# Patient Record
Sex: Male | Born: 1990 | Race: White | Hispanic: No | Smoking: Former smoker
Health system: Southern US, Community
[De-identification: ages and names within clinical notes are randomized; demographics above are authoritative.]

## PROBLEM LIST (undated history)

## (undated) DIAGNOSIS — N200 Calculus of kidney: Secondary | ICD-10-CM

## (undated) HISTORY — DX: Calculus of kidney: N20.0

## (undated) HISTORY — PX: APPENDECTOMY: SHX54

---

## 2019-02-28 ENCOUNTER — Emergency Department (HOSPITAL_BASED_OUTPATIENT_CLINIC_OR_DEPARTMENT_OTHER): Payer: 59

## 2019-02-28 ENCOUNTER — Emergency Department (HOSPITAL_BASED_OUTPATIENT_CLINIC_OR_DEPARTMENT_OTHER)
Admission: EM | Admit: 2019-02-28 | Discharge: 2019-02-28 | Disposition: A | Discharge status: Home / Self Care | Payer: 59 | Attending: Emergency Medicine | Admitting: Emergency Medicine

## 2019-02-28 ENCOUNTER — Other Ambulatory Visit: Payer: Self-pay

## 2019-02-28 ENCOUNTER — Encounter (HOSPITAL_BASED_OUTPATIENT_CLINIC_OR_DEPARTMENT_OTHER): Payer: Self-pay | Admitting: Emergency Medicine

## 2019-02-28 DIAGNOSIS — Z79899 Other long term (current) drug therapy: Secondary | ICD-10-CM | POA: Insufficient documentation

## 2019-02-28 DIAGNOSIS — R1031 Right lower quadrant pain: Secondary | ICD-10-CM | POA: Diagnosis present

## 2019-02-28 DIAGNOSIS — N201 Calculus of ureter: Secondary | ICD-10-CM | POA: Insufficient documentation

## 2019-02-28 LAB — BASIC METABOLIC PANEL
Anion gap: 7 (ref 5–15)
BUN: 11 mg/dL (ref 6–20)
CO2: 26 mmol/L (ref 22–32)
Calcium: 8.8 mg/dL — ABNORMAL LOW (ref 8.9–10.3)
Chloride: 106 mmol/L (ref 98–111)
Creatinine, Ser: 0.89 mg/dL (ref 0.61–1.24)
GFR calc Af Amer: >60 mL/min (ref >60)
GFR calc non Af Amer: >60 mL/min (ref >60)
Glucose, Bld: 87 mg/dL (ref 70–99)
Potassium: 3.3 mmol/L — ABNORMAL LOW (ref 3.5–5.1)
Sodium: 139 mmol/L (ref 135–145)

## 2019-02-28 LAB — URINALYSIS, ROUTINE W REFLEX MICROSCOPIC
Glucose, UA: NEGATIVE mg/dL
Ketones, ur: NEGATIVE mg/dL
Nitrite: NEGATIVE
Protein, ur: 30 mg/dL — AB
Specific Gravity, Urine: >1.03 — ABNORMAL HIGH (ref 1.005–1.030)
pH: 5.5 (ref 5.0–8.0)

## 2019-02-28 LAB — CBC WITH DIFFERENTIAL/PLATELET
Abs Immature Granulocytes: 0.01 10*3/uL (ref 0.00–0.07)
Basophils Absolute: 0 10*3/uL (ref 0.0–0.1)
Basophils Relative: 1 %
Eosinophils Absolute: 0.2 10*3/uL (ref 0.0–0.5)
Eosinophils Relative: 5 %
HCT: 51.6 % (ref 39.0–52.0)
Hemoglobin: 16.8 g/dL (ref 13.0–17.0)
Immature Granulocytes: 0 %
Lymphocytes Relative: 27 %
Lymphs Abs: 1.5 10*3/uL (ref 0.7–4.0)
MCH: 31.1 pg (ref 26.0–34.0)
MCHC: 32.6 g/dL (ref 30.0–36.0)
MCV: 95.6 fL (ref 80.0–100.0)
Monocytes Absolute: 0.5 10*3/uL (ref 0.1–1.0)
Monocytes Relative: 9 %
Neutro Abs: 3.2 10*3/uL (ref 1.7–7.7)
Neutrophils Relative %: 58 %
Platelets: 222 10*3/uL (ref 150–400)
RBC: 5.4 MIL/uL (ref 4.22–5.81)
RDW: 13.1 % (ref 11.5–15.5)
WBC: 5.4 10*3/uL (ref 4.0–10.5)
nRBC: 0 % (ref 0.0–0.2)

## 2019-02-28 LAB — URINALYSIS, MICROSCOPIC (REFLEX): RBC / HPF: >50 RBC/hpf (ref 0–5)

## 2019-02-28 MED ORDER — HYDROCODONE-ACETAMINOPHEN 5-325 MG PO TABS: 1.0000 | ORAL_TABLET | Freq: Four times a day (QID) | ORAL | 0 refills | Status: AC | PRN

## 2019-02-28 MED ORDER — ONDANSETRON HCL 4 MG PO TABS: 4.0000 mg | ORAL_TABLET | Freq: Four times a day (QID) | ORAL | 0 refills | Status: AC | PRN

## 2019-02-28 MED ORDER — TAMSULOSIN HCL 0.4 MG PO CAPS: 0.4000 mg | ORAL_CAPSULE | Freq: Every day | ORAL | 0 refills | Status: AC

## 2019-02-28 MED ORDER — SODIUM CHLORIDE 0.9 % IV BOLUS
1000.0000 mL | Freq: Once | INTRAVENOUS | Status: AC
  Administered 2019-02-28: 1000 mL via INTRAVENOUS

## 2019-02-28 MED ORDER — IBUPROFEN 600 MG PO TABS: 600.0000 mg | ORAL_TABLET | Freq: Four times a day (QID) | ORAL | 0 refills | Status: AC | PRN

## 2019-02-28 MED FILL — ONDANSETRON HCL 4 MG TABLET: 4 | 3 days supply | Qty: 12 | Fill #0

## 2019-02-28 MED FILL — HYDROCODON-APAP 5-325: 5-325 | 2 days supply | Qty: 10 | Fill #0

## 2019-02-28 MED FILL — TAMSULOSIN HCL 0.4 MG CAP: 0.4 | 7 days supply | Qty: 7 | Fill #0

## 2019-02-28 MED FILL — IBUPROFEN 600 MG TABLET: 600 | 7 days supply | Qty: 30 | Fill #0

## 2019-02-28 NOTE — ED Provider Notes (Signed)
[facility] EMERGENCY DEPARTMENT Provider Note   CSN: [id number] Arrival date & time: 02/28/19  1006    History   Chief Complaint Chief Complaint  Patient presents with  . Flank Pain  . Hematuria    HPI Russell Baker is a 28 y.o. male who is previously healthy who presents with a 3-week history of hematuria and right flank pain.  Patient reports 3 weeks ago he began with some vague back pain and then began seeing blood in his urine.  He was seen at [facility] urgent care last week and treated for urinary tract infection with Cipro.  He has been taking Cipro for 1 week and continues to have worsening right flank pain and hematuria.  He denies any fevers, chest pain, shortness of breath, nausea, vomiting, scrotal swelling or pain, penile discharge.  He denies any concern for STD exposures.  He has no history of UTI.     HPI  History reviewed. No pertinent past medical history.  There are no active problems to display for this patient.     Home Medications    Prior to Admission medications   Medication Sig Start Date End Date Taking? Authorizing Provider  loratadine (CLARITIN) 10 MG tablet Take 10 mg by mouth daily.   Yes [provider]  ciprofloxacin (CIPRO) 500 MG tablet TK 1 T PO BID 02/22/19   [provider]  HYDROcodone-acetaminophen (NORCO/VICODIN) 5-325 MG tablet Take 1 tablet by mouth every 6 (six) hours as needed. 02/28/19   Mayu Ronk, Waylan Boga, PA-C  ibuprofen (ADVIL,MOTRIN) 600 MG tablet Take 1 tablet (600 mg total) by mouth every 6 (six) hours as needed. 02/28/19   Alantis Bethune, Waylan Boga, PA-C  ondansetron (ZOFRAN) 4 MG tablet Take 1 tablet (4 mg total) by mouth every 6 (six) hours as needed for nausea or vomiting. 02/28/19   Maks Cavallero, Waylan Boga, PA-C  tamsulosin (FLOMAX) 0.4 MG CAPS capsule Take 1 capsule (0.4 mg total) by mouth daily after supper. 02/28/19   Emi Holes, PA-C    Family History No family history on file.  Social History Social  History   Tobacco Use  . Smoking status: Not on file  Substance Use Topics  . Alcohol use: Not on file  . Drug use: Not on file     Allergies   Patient has no allergy information on record.   Review of Systems Review of Systems  Constitutional: Negative for chills and fever.  HENT: Negative for facial swelling and sore throat.   Respiratory: Negative for shortness of breath.   Cardiovascular: Negative for chest pain.  Gastrointestinal: Negative for abdominal pain, nausea and vomiting.  Genitourinary: Positive for flank pain and hematuria. Negative for discharge, dysuria, penile pain, penile swelling, scrotal swelling and testicular pain.  Musculoskeletal: Positive for back pain.  Skin: Negative for rash and wound.  Neurological: Negative for headaches.  Psychiatric/Behavioral: The patient is not nervous/anxious.      Physical Exam Updated Vital Signs BP 134/82 (BP Location: Right Arm)   Pulse (!) 116   Temp 97.8 F (36.6 C) (Oral)   Resp 18   Ht 5\' 10"  (1.778 m)   Wt 61.2 kg   SpO2 100%   BMI 19.37 kg/m   Physical Exam Vitals signs and nursing note reviewed.  Constitutional:      General: He is not in acute distress.    Appearance: He is well-developed. He is not diaphoretic.  HENT:     Head: Normocephalic and atraumatic.  Mouth/Throat:     Pharynx: No oropharyngeal exudate.  Eyes:     General: No scleral icterus.       Right eye: No discharge.        Left eye: No discharge.     Conjunctiva/sclera: Conjunctivae normal.     Pupils: Pupils are equal, round, and reactive to light.  Neck:     Musculoskeletal: Normal range of motion and neck supple.     Thyroid: No thyromegaly.  Cardiovascular:     Rate and Rhythm: Normal rate and regular rhythm.     Heart sounds: Normal heart sounds. No murmur. No friction rub. No gallop.   Pulmonary:     Effort: Pulmonary effort is normal. No respiratory distress.     Breath sounds: Normal breath sounds. No stridor. No  wheezing or rales.  Abdominal:     General: Bowel sounds are normal. There is no distension.     Palpations: Abdomen is soft.     Tenderness: There is abdominal tenderness in the right lower quadrant. There is no right CVA tenderness, left CVA tenderness, guarding or rebound.     Comments: Right flank and right lower quadrant tenderness, but no tenderness at the right CVA  Lymphadenopathy:     Cervical: No cervical adenopathy.  Skin:    General: Skin is warm and dry.     Coloration: Skin is not pale.     Findings: No rash.  Neurological:     Mental Status: He is alert.     Coordination: Coordination normal.      ED Treatments / Results  Labs (all labs ordered are listed, but only abnormal results are displayed) Labs Reviewed  BASIC METABOLIC PANEL - Abnormal; Notable for the following components:      Result Value   Potassium 3.3 (*)    Calcium 8.8 (*)    All other components within normal limits  URINALYSIS, ROUTINE W REFLEX MICROSCOPIC - Abnormal; Notable for the following components:   Color, Urine BROWN (*)    APPearance CLOUDY (*)    Specific Gravity, Urine >1.030 (*)    Hgb urine dipstick LARGE (*)    Bilirubin Urine SMALL (*)    Protein, ur 30 (*)    Leukocytes,Ua TRACE (*)    All other components within normal limits  URINALYSIS, MICROSCOPIC (REFLEX) - Abnormal; Notable for the following components:   Bacteria, UA RARE (*)    All other components within normal limits  URINE CULTURE  CBC WITH DIFFERENTIAL/PLATELET    EKG None  Radiology Ct Renal Stone Study  Result Date: 02/28/2019 CLINICAL DATA:  Low back pain, right flank pain, hematuria EXAM: CT ABDOMEN AND PELVIS WITHOUT CONTRAST TECHNIQUE: Multidetector CT imaging of the abdomen and pelvis was performed following the standard protocol without IV contrast. COMPARISON:  None. FINDINGS: Lower chest: No acute abnormality. Hepatobiliary: No focal liver abnormality is seen. No gallstones, gallbladder wall  thickening, or biliary dilatation. Pancreas: Unremarkable. No pancreatic ductal dilatation or surrounding inflammatory changes. Spleen: Normal in size without focal abnormality. Adrenals/Urinary Tract: Adrenal glands are unremarkable. There is a 5 mm obstructive calculus of the proximal right ureter mild associated right hydronephrosis. There is additional 2 mm calculus within the urinary bladder or at the most distal right ureterovesicular junction (series 2, image 70). Stomach/Bowel: Stomach is within normal limits. Status post appendectomy. No evidence of bowel wall thickening, distention, or inflammatory changes. Vascular/Lymphatic: No significant vascular findings are present. No enlarged abdominal or pelvic lymph nodes. Reproductive:  No mass or other abnormality. Other: No abdominal wall hernia or abnormality. No abdominopelvic ascites. Musculoskeletal: No acute or significant osseous findings. IMPRESSION: There is a 5 mm obstructive calculus of the proximal right ureter mild associated right hydronephrosis. There is additional 2 mm calculus within the urinary bladder or at the most distal right ureterovesicular junction (series 2, image 70). Electronically Signed   By: Lauralyn Primes M.D.   On: 02/28/2019 11:03    Procedures Procedures (including critical care time)  Medications Ordered in ED Medications  sodium chloride 0.9 % bolus 1,000 mL (1,000 mLs Intravenous New Bag/Given 02/28/19 1104)     Initial Impression / Assessment and Plan / ED Course  I have reviewed the triage vital signs and the nursing notes.  Pertinent labs & imaging results that were available during my care of the patient were reviewed by me and considered in my medical decision making (see chart for details).        Patient with 5 mm calculus in the right ureter as well as 2 mm calculus within the urinary bladder or distal right UVJ, with mild hydronephrosis.  Kidney function within normal limits.  No infection seen on  UA, however patient has been on Cipro.  Will advise continuation considering I cannot review previous UA at urgent care.  Patient will be referred to urology.  Diet modification discussed, as patient states he drinks a lot of diet sodas.  Patient was discharged home with short course of Vicodin, ibuprofen, Flomax, Zofran.  Return precautions discussed.  Patient understands and agrees with plan.  Patient vitals stable throughout ED course and discharged in satisfactory condition.  Final Clinical Impressions(s) / ED Diagnoses   Final diagnoses:  Ureterolithiasis    ED Discharge Orders         Ordered    HYDROcodone-acetaminophen (NORCO/VICODIN) 5-325 MG tablet  Every 6 hours PRN     02/28/19 1148    ibuprofen (ADVIL,MOTRIN) 600 MG tablet  Every 6 hours PRN     02/28/19 1148    tamsulosin (FLOMAX) 0.4 MG CAPS capsule  Daily after supper     02/28/19 1148    ondansetron (ZOFRAN) 4 MG tablet  Every 6 hours PRN     02/28/19 [address], PA-C 02/28/19 1150    Azalia Bilis, MD 02/28/19 1212

## 2019-02-28 NOTE — Discharge Instructions (Signed)
You have 2 kidney stones seen on your CT today.  One is 5 mm and the other is 2 mm.  The smaller one is either in your bladder or very close to your bladder.  You can take ibuprofen every 6 hours as needed for your pain.  For breakthrough pain, you can take 1 Norco every 6 hours.  Do not drive or operate machinery while taking this medication.  To help the stone pass, you can take Flomax daily after supper.  For nausea or vomiting, you can take Zofran every 6 hours as needed.  Please follow-up with urologist within the week, for further evaluation and treatment of your kidney stones.  Please return to the emergency department if you develop any new or worsening symptoms including fever over 100.4, severe, worsening pain unrelenting with pain medication, intractable vomiting, or any other new or concerning symptoms.  Do not drink alcohol, drive, operate machinery or participate in any other potentially dangerous activities while taking opiate pain medication as it may make you sleepy. Do not take this medication with any other sedating medications, either prescription or over-the-counter. If you were prescribed Percocet or Vicodin, do not take these with acetaminophen (Tylenol) as it is already contained within these medications and overdose of Tylenol is dangerous.   This medication is an opiate (or narcotic) pain medication and can be habit forming.  Use it as little as possible to achieve adequate pain control.  Do not use or use it with extreme caution if you have a history of opiate abuse or dependence. This medication is intended for your use only - do not give any to anyone else and keep it in a secure place where nobody else, especially children, have access to it. It will also cause or worsen constipation, so you may want to consider taking an over-the-counter stool softener while you are taking this medication.

## 2019-02-28 NOTE — ED Triage Notes (Signed)
Pt states he had lower back pain x 2 weeks ago. Woke up last Tuesday and had hematuria. Had tests on Tuesday at [facility] and was prescribed abx. States he woke up this am with right flank pain and hematuria. Pt ambulatory to room

## 2019-03-01 LAB — URINE CULTURE: Culture: NO GROWTH

## 2019-03-08 MED FILL — TAMSULOSIN HCL 0.4 MG CAP: 0.4 | 20 days supply | Qty: 20 | Fill #0

## 2019-03-10 MED FILL — OXYCODONE-ACETAMINOPHEN 5-3: 5-325 | 3 days supply | Qty: 10 | Fill #0

## 2019-11-18 IMAGING — CT CT RENAL STONE PROTOCOL
2 of 4 series · 16 of 46 positions shown, 18 images · non-contrast
Comparison: None.

CLINICAL DATA: Low back pain, right flank pain, hematuria

EXAM:
CT ABDOMEN AND PELVIS WITHOUT CONTRAST
TECHNIQUE: Multidetector CT imaging of the abdomen and pelvis was performed
following the standard protocol without IV contrast.

[Series 2: axial st · axial · 0.66mm/px · z∈[-577,-137]mm · 13 of 97 slices shown, 15 images]
[im 5/97  soft-tissue]
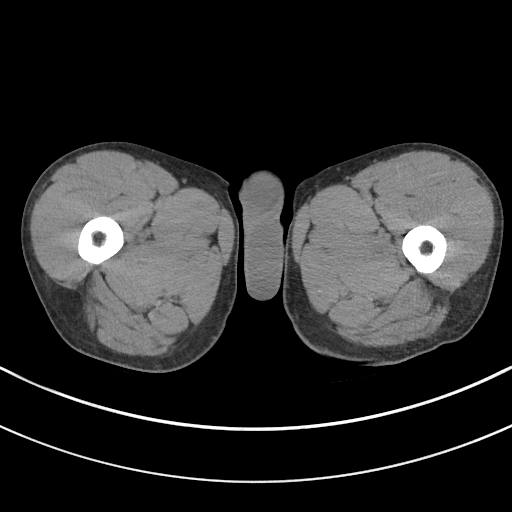
[im 5/97  bone]
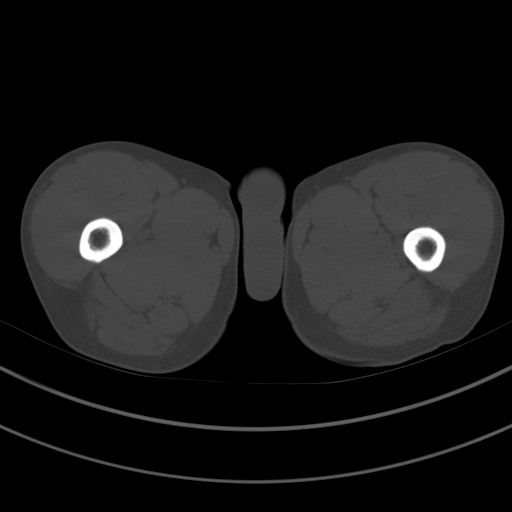
[im 13/97  soft-tissue]
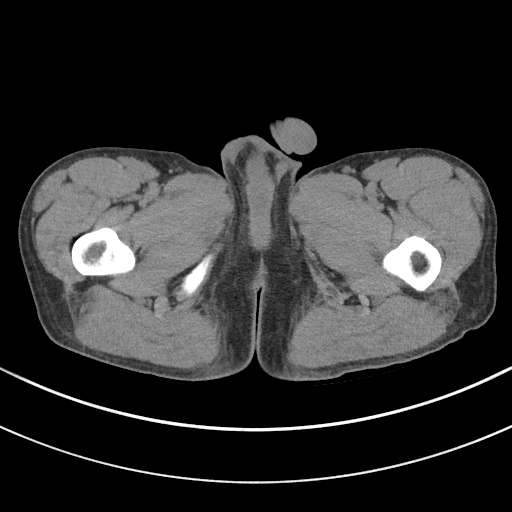
[im 21/97  soft-tissue]
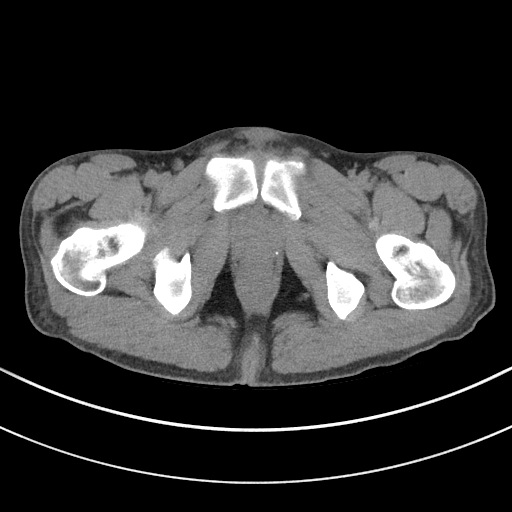
[im 29/97  soft-tissue]
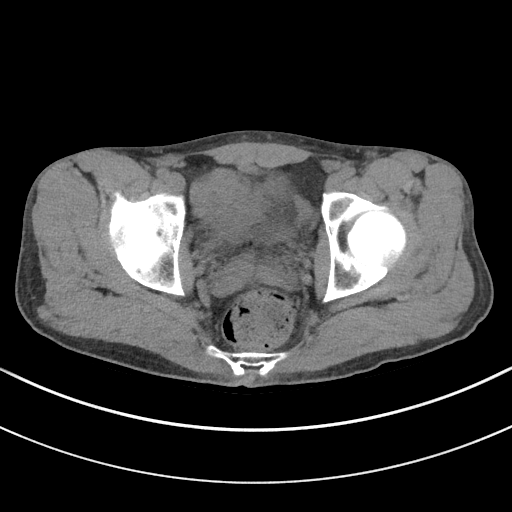
[im 33/97  soft-tissue]
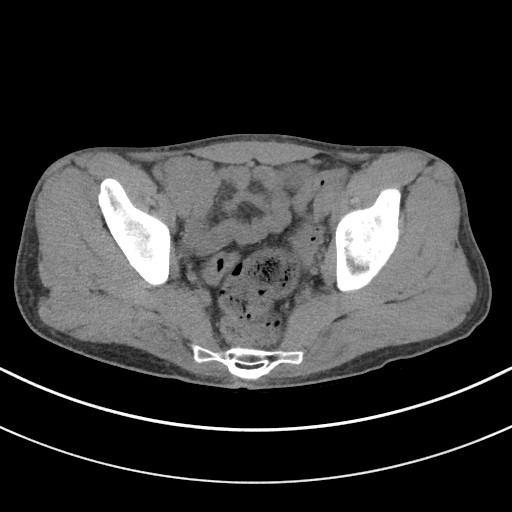
[im 41/97  soft-tissue]
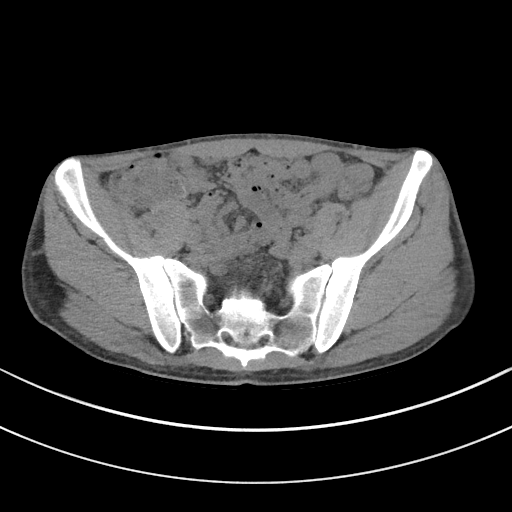
[im 49/97  soft-tissue]
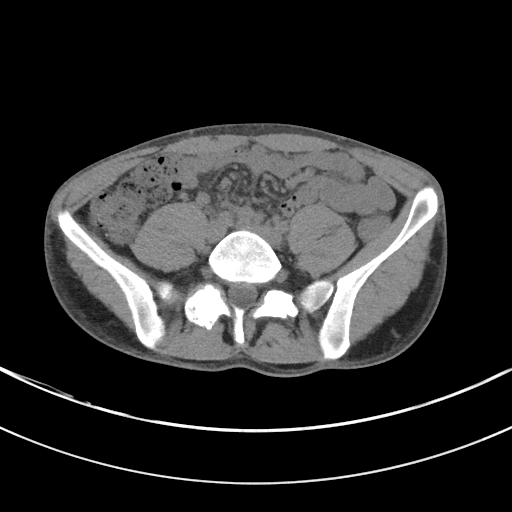
[im 57/97  soft-tissue]
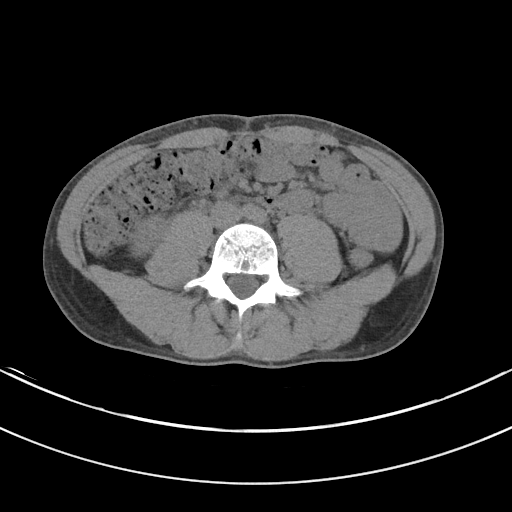
[im 65/97  soft-tissue]
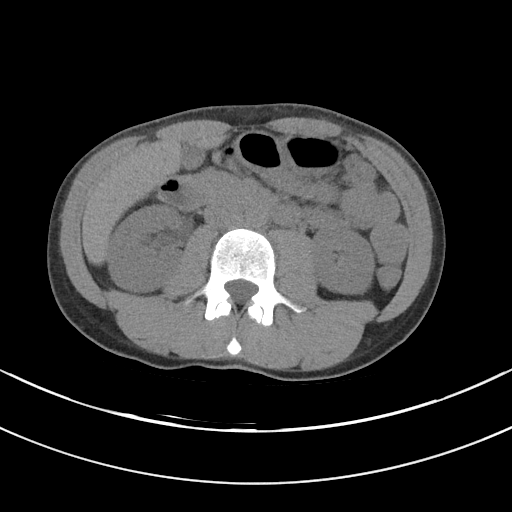
[im 65/97  bone]
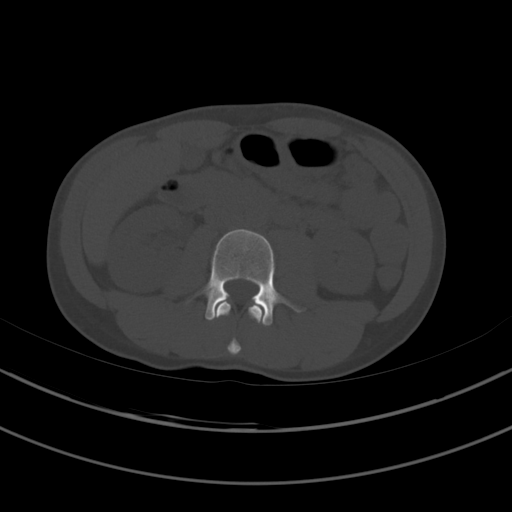
[im 69/97  soft-tissue]
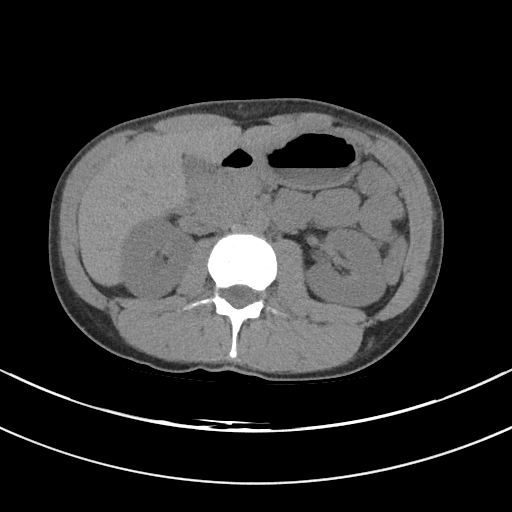
[im 77/97  soft-tissue]
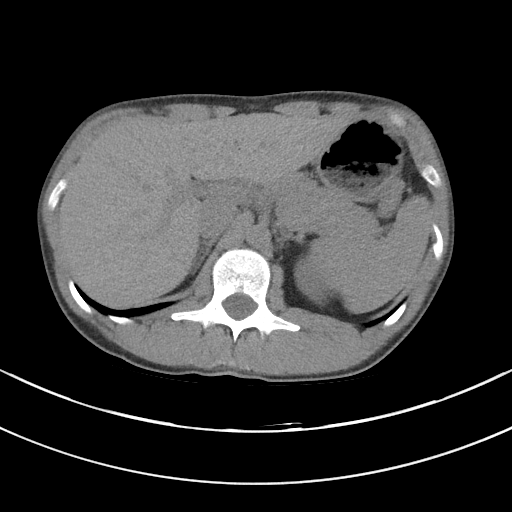
[im 85/97  soft-tissue]
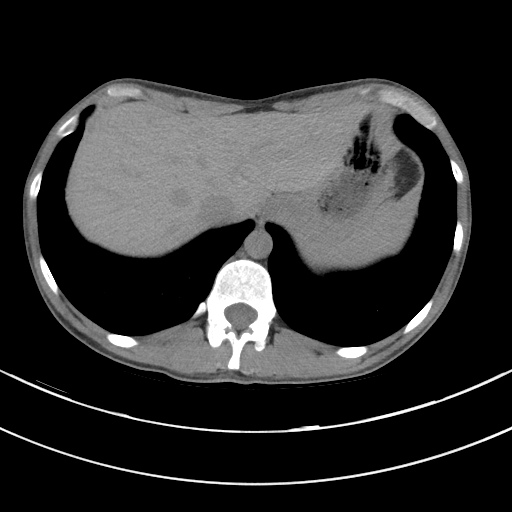
[im 93/97  soft-tissue]
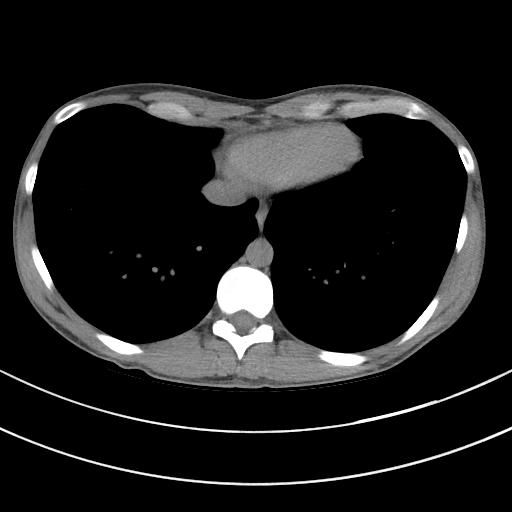

[Series 4: coronal st · coronal · 0.66mm/px · 3 of 65 slices shown]
[im 22/65  soft-tissue]
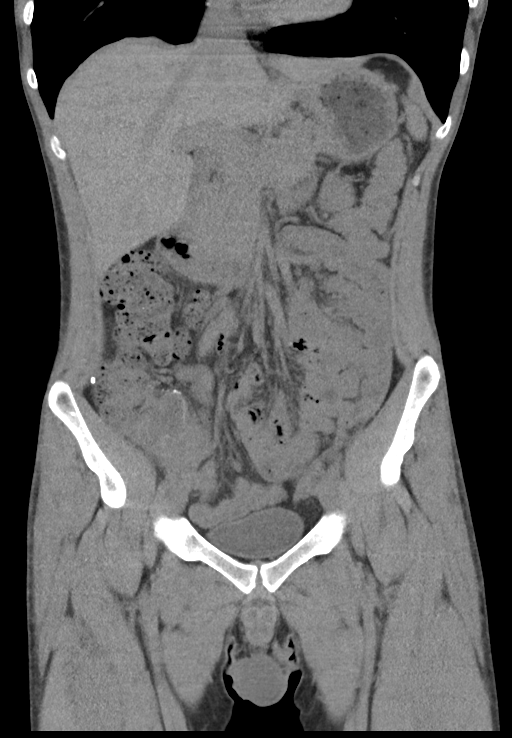
[im 29/65  soft-tissue]
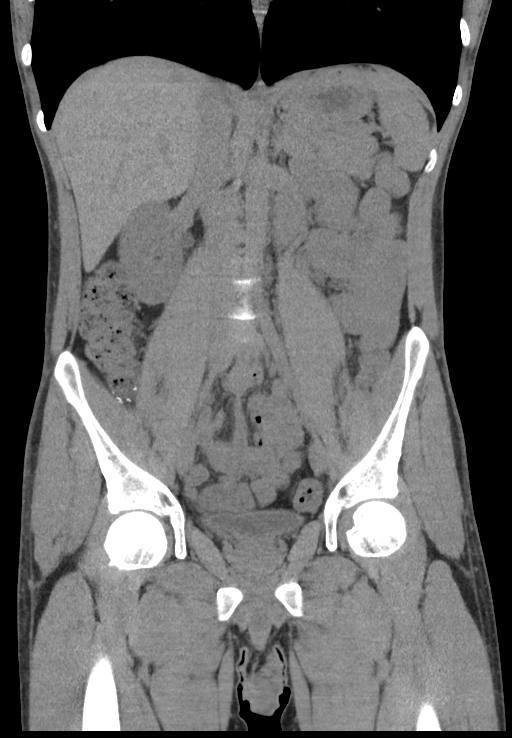
[im 36/65  soft-tissue]
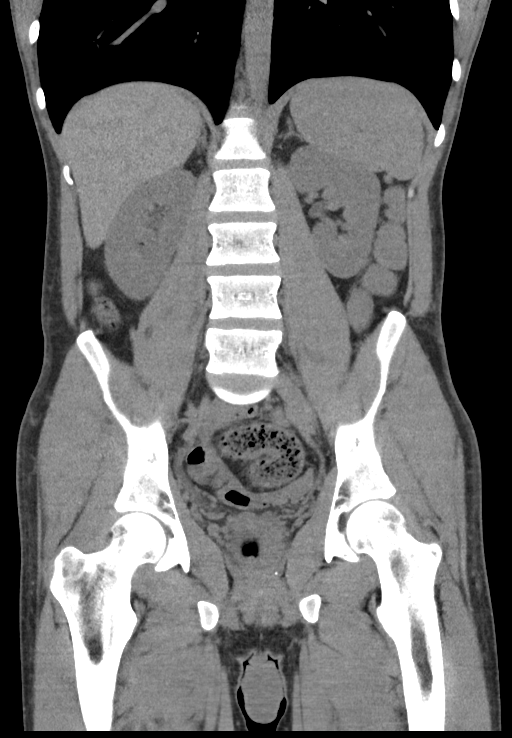

[16 of 46 positions shown; findings below may reference images not displayed]

FINDINGS: Lower chest: No acute abnormality.

Hepatobiliary: No focal liver abnormality is seen. No gallstones,
gallbladder wall thickening, or biliary dilatation.

Pancreas: Unremarkable. No pancreatic ductal dilatation or
surrounding inflammatory changes.

Spleen: Normal in size without focal abnormality.

Adrenals/Urinary Tract: Adrenal glands are unremarkable. There is a
5 mm obstructive calculus of the proximal right ureter mild
associated right hydronephrosis. There is additional 2 mm calculus
within the urinary bladder or at the most distal right
ureterovesicular junction (series 2, image 70).

Stomach/Bowel: Stomach is within normal limits. Status post
appendectomy. No evidence of bowel wall thickening, distention, or
inflammatory changes.

Vascular/Lymphatic: No significant vascular findings are present. No
enlarged abdominal or pelvic lymph nodes.

Reproductive: No mass or other abnormality.

Other: No abdominal wall hernia or abnormality. No abdominopelvic
ascites.

Musculoskeletal: No acute or significant osseous findings.
IMPRESSION: There is a 5 mm obstructive calculus of the proximal right ureter
mild associated right hydronephrosis. There is additional 2 mm
calculus within the urinary bladder or at the most distal right
ureterovesicular junction (series 2, image 70).

## 2019-12-21 ENCOUNTER — Ambulatory Visit: Payer: 59 | Attending: Internal Medicine

## 2019-12-21 DIAGNOSIS — Z20822 Contact with and (suspected) exposure to covid-19: Secondary | ICD-10-CM

## 2019-12-22 LAB — NOVEL CORONAVIRUS, NAA: SARS-CoV-2, NAA: NOT DETECTED

## 2020-09-19 ENCOUNTER — Ambulatory Visit (INDEPENDENT_AMBULATORY_CARE_PROVIDER_SITE_OTHER): Payer: 59 | Admitting: Medical

## 2020-09-19 ENCOUNTER — Other Ambulatory Visit: Payer: Self-pay

## 2020-09-19 ENCOUNTER — Encounter: Payer: Self-pay | Admitting: Medical

## 2020-09-19 VITALS — BP 136/84 | HR 99 | Temp 98.2°F | Resp 18 | Ht 70.0 in | Wt 134.0 lb

## 2020-09-19 DIAGNOSIS — Z0000 Encounter for general adult medical examination without abnormal findings: Secondary | ICD-10-CM

## 2020-09-19 DIAGNOSIS — Z23 Encounter for immunization: Secondary | ICD-10-CM

## 2020-09-19 DIAGNOSIS — R0981 Nasal congestion: Secondary | ICD-10-CM | POA: Diagnosis not present

## 2020-09-19 DIAGNOSIS — F17210 Nicotine dependence, cigarettes, uncomplicated: Secondary | ICD-10-CM | POA: Diagnosis not present

## 2020-09-19 DIAGNOSIS — F17200 Nicotine dependence, unspecified, uncomplicated: Secondary | ICD-10-CM

## 2020-09-19 NOTE — Addendum Note (Signed)
Addended by: Gwenevere Abbot on: 09/19/2020 10:45 AM   Modules accepted: Orders

## 2020-09-19 NOTE — Progress Notes (Addendum)
Subjective:    Patient ID: Russell Baker, male    DOB: 11-Jul-1991, 29 y.o.   MRN: [id number]  HPI  Pt in for first time. 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 Pt does not exercise regularly. Smoke about 1/2 pack a day since 29 yo. Occasional alcohol use once every 2-3 week 1-2 beers. Pt admits moderate fried foods. Rare soda small can 1-2 times a week. Drinks about 2 cups of coffee a day. Single/divorced. But now engaged.   Hx of kidney stone. Passed  5 mm and 2 mm stone.  Pt has not had covid vaccine.   Will get flu vaccine today.   Review of Systems  Constitutional: Negative for chills, fatigue and fever.  HENT: Negative for congestion, ear discharge and ear pain.        Chronic nasal congestion. Seemed to occure after he broke nose in 2018. He feels like he is mouth breather now.   Respiratory: Negative for chest tightness, shortness of breath and wheezing.   Cardiovascular: Negative for chest pain and palpitations.  Gastrointestinal: Negative for abdominal pain, constipation and nausea.  Musculoskeletal: Negative for back pain and myalgias.  Skin: Negative for rash.  Neurological: Negative for dizziness, seizures and light-headedness.  Hematological: Negative for adenopathy. Does not bruise/bleed easily.  Psychiatric/Behavioral: Negative for behavioral problems, confusion and sleep disturbance. The patient is not nervous/anxious.     No past medical history on file.   Social History   Socioeconomic History  . Marital status: Significant Other    Spouse name: Not on file  . Number of children: Not on file  . Years of education: Not on file  . Highest education level: Not on file  Occupational History  . Not on file  Tobacco Use  . Smoking status: Not on file  Substance and Sexual Activity  . Alcohol use: Not on file  . Drug use: Not on file  . Sexual activity: Not on file  Other Topics Concern  . Not on file  Social  History Narrative  . Not on file   Social Determinants of Health   Financial Resource Strain:   . Difficulty of Paying Living Expenses: Not on file  Food Insecurity:   . Worried About Programme researcher, broadcasting/film/video in the Last Year: Not on file  . Ran Out of Food in the Last Year: Not on file  Transportation Needs:   . Lack of Transportation (Medical): Not on file  . Lack of Transportation (Non-Medical): Not on file  Physical Activity:   . Days of Exercise per Week: Not on file  . Minutes of Exercise per Session: Not on file  Stress:   . Feeling of Stress : Not on file  Social Connections:   . Frequency of Communication with Friends and Family: Not on file  . Frequency of Social Gatherings with Friends and Family: Not on file  . Attends Religious Services: Not on file  . Active Member of Clubs or Organizations: Not on file  . Attends Banker Meetings: Not on file  . Marital Status: Not on file  Intimate Partner Violence:   . Fear of Current or Ex-Partner: Not on file  . Emotionally Abused: Not on file  . Physically Abused: Not on file  . Sexually Abused: Not on file    Past Surgical History:  Procedure Laterality Date  . APPENDECTOMY      No family history on file.  Not  on File  Current Outpatient Medications on File Prior to Visit  Medication Sig Dispense Refill  . ciprofloxacin (CIPRO) 500 MG tablet TK 1 T PO BID (Patient not taking: Reported on 09/19/2020)    . HYDROcodone-acetaminophen (NORCO/VICODIN) 5-325 MG tablet Take 1 tablet by mouth every 6 (six) hours as needed. (Patient not taking: Reported on 09/19/2020) 10 tablet 0  . ibuprofen (ADVIL,MOTRIN) 600 MG tablet Take 1 tablet (600 mg total) by mouth every 6 (six) hours as needed. (Patient not taking: Reported on 09/19/2020) 30 tablet 0  . loratadine (CLARITIN) 10 MG tablet Take 10 mg by mouth daily. (Patient not taking: Reported on 09/19/2020)    . ondansetron (ZOFRAN) 4 MG tablet Take 1 tablet (4 mg total) by  mouth every 6 (six) hours as needed for nausea or vomiting. (Patient not taking: Reported on 09/19/2020) 12 tablet 0  . tamsulosin (FLOMAX) 0.4 MG CAPS capsule Take 1 capsule (0.4 mg total) by mouth daily after supper. (Patient not taking: Reported on 09/19/2020) 7 capsule 0   No current facility-administered medications on file prior to visit.    BP 136/84   Pulse 99   Temp 98.2 F (36.8 C) (Oral)   Resp 18   Ht 5\' 10"  (1.778 m)   Wt 134 lb (60.8 kg)   SpO2 100%   BMI 19.23 kg/m       Objective:   Physical Exam  General Mental Status- Alert. General Appearance- Not in acute distress.   Skin General: Color- Normal Color. Moisture- Normal Moisture.  Neck No thyromegaly. No jvd.  Chest and Lung Exam Auscultation: Breath Sounds:-Normal.  Cardiovascular Auscultation:Rythm- Regular. Murmurs & Other Heart Sounds:Auscultation of the heart reveals- No Murmurs.  Abdomen Inspection:-Inspeection Normal. Palpation/Percussion:Note:No mass. Palpation and Percussion of the abdomen reveal- Non Tender, Non Distended + BS, no rebound or guarding.   Neurologic Cranial Nerve exam:- CN III-XII intact(No nystagmus), symmetric smile. Strength:- 5/5 equal and symmetric strength both upper and lower extremities.  heent- narrow nostrils. Possible deviated septum.      Assessment & Plan:  For you wellness exam today I have ordered cbc, cmp and lipid panel.  Flu vaccine today.   Recommend exercise and healthy diet.  We will let you know lab results as they come in.  Follow up date appointment will be determined after lab review.   Discussed smoking cessation. Medication called wellbutrin might be helpful. Explained how can be used. Can decide later and let me know if you want prescription.  , PA-C

## 2020-09-19 NOTE — Addendum Note (Signed)
Addended by: Maximino Sarin on: 09/19/2020 11:10 AM   Modules accepted: Orders

## 2020-09-19 NOTE — Patient Instructions (Addendum)
For you wellness exam today I have ordered cbc, cmp and lipid panel.  Flu vaccine today.   Recommend exercise and healthy diet.  We will let you know lab results as they come in.  Follow up date appointment will be determined after lab review.   Discussed smoking cessation. Medication called wellbutrin might be helpful. Explained how can be used. Can decide later and let me know if you want prescription.   Preventive Care 43-31 Years Old, Male Preventive care refers to lifestyle choices and visits with your health care provider that can promote health and wellness. This includes:  A yearly physical exam. This is also called an annual well check.  Regular dental and eye exams.  Immunizations.  Screening for certain conditions.  Healthy lifestyle choices, such as eating a healthy diet, getting regular exercise, not using drugs or products that contain nicotine and tobacco, and limiting alcohol use. What can I expect for my preventive care visit? Physical exam Your health care provider will check:  Height and weight. These may be used to calculate body mass index (BMI), which is a measurement that tells if you are at a healthy weight.  Heart rate and blood pressure.  Your skin for abnormal spots. Counseling Your health care provider may ask you questions about:  Alcohol, tobacco, and drug use.  Emotional well-being.  Home and relationship well-being.  Sexual activity.  Eating habits.  Work and work Statistician. What immunizations do I need?  Influenza (flu) vaccine  This is recommended every year. Tetanus, diphtheria, and pertussis (Tdap) vaccine  You may need a Td booster every 10 years. Varicella (chickenpox) vaccine  You may need this vaccine if you have not already been vaccinated. Human papillomavirus (HPV) vaccine  If recommended by your health care provider, you may need three doses over 6 months. Measles, mumps, and rubella (MMR) vaccine  You may  need at least one dose of MMR. You may also need a second dose. Meningococcal conjugate (MenACWY) vaccine  One dose is recommended if you are 60-37 years old and a Market researcher living in a residence hall, or if you have one of several medical conditions. You may also need additional booster doses. Pneumococcal conjugate (PCV13) vaccine  You may need this if you have certain conditions and were not previously vaccinated. Pneumococcal polysaccharide (PPSV23) vaccine  You may need one or two doses if you smoke cigarettes or if you have certain conditions. Hepatitis A vaccine  You may need this if you have certain conditions or if you travel or work in places where you may be exposed to hepatitis A. Hepatitis B vaccine  You may need this if you have certain conditions or if you travel or work in places where you may be exposed to hepatitis B. Haemophilus influenzae type b (Hib) vaccine  You may need this if you have certain risk factors. You may receive vaccines as individual doses or as more than one vaccine together in one shot (combination vaccines). Talk with your health care provider about the risks and benefits of combination vaccines. What tests do I need? Blood tests  Lipid and cholesterol levels. These may be checked every 5 years starting at age 76.  Hepatitis C test.  Hepatitis B test. Screening   Diabetes screening. This is done by checking your blood sugar (glucose) after you have not eaten for a while (fasting).  Sexually transmitted disease (STD) testing. Talk with your health care provider about your test results, treatment options,  and if necessary, the need for more tests. Follow these instructions at home: Eating and drinking   Eat a diet that includes fresh fruits and vegetables, whole grains, lean protein, and low-fat dairy products.  Take vitamin and mineral supplements as recommended by your health care provider.  Do not drink alcohol if  your health care provider tells you not to drink.  If you drink alcohol: ? Limit how much you have to 0-2 drinks a day. ? Be aware of how much alcohol is in your drink. In the [place] one drink equals one 12 oz bottle of beer (355 mL), one 5 oz glass of wine (148 mL), or one 1 oz glass of hard liquor (44 mL). Lifestyle  Take daily care of your teeth and gums.  Stay active. Exercise for at least 30 minutes on 5 or more days each week.  Do not use any products that contain nicotine or tobacco, such as cigarettes, e-cigarettes, and chewing tobacco. If you need help quitting, ask your health care provider.  If you are sexually active, practice safe sex. Use a condom or other form of protection to prevent STIs (sexually transmitted infections). What's next?  Go to your health care provider once a year for a well check visit.  Ask your health care provider how often you should have your eyes and teeth checked.  Stay up to date on all vaccines. This information is not intended to replace advice given to you by your health care provider. Make sure you discuss any questions you have with your health care provider. Document Revised: 11/25/2018 Document Reviewed: 11/25/2018 Elsevier Patient Education  2020 [employer].

## 2020-09-20 LAB — CBC WITH DIFFERENTIAL/PLATELET
Absolute Monocytes: 263 cells/uL (ref 200–950)
Basophils Absolute: 30 cells/uL (ref 0–200)
Basophils Relative: 0.8 %
Eosinophils Absolute: 130 cells/uL (ref 15–500)
Eosinophils Relative: 3.5 %
HCT: 49.1 % (ref 38.5–50.0)
Hemoglobin: 16.6 g/dL (ref 13.2–17.1)
Lymphs Abs: 1210 cells/uL (ref 850–3900)
MCH: 29.4 pg (ref 27.0–33.0)
MCHC: 33.8 g/dL (ref 32.0–36.0)
MCV: 87.1 fL (ref 80.0–100.0)
MPV: 10.3 fL (ref 7.5–12.5)
Monocytes Relative: 7.1 %
Neutro Abs: 2068 cells/uL (ref 1500–7800)
Neutrophils Relative %: 55.9 %
Platelets: 229 10*3/uL (ref 140–400)
RBC: 5.64 10*6/uL (ref 4.20–5.80)
RDW: 12.4 % (ref 11.0–15.0)
Total Lymphocyte: 32.7 %
WBC: 3.7 10*3/uL — ABNORMAL LOW (ref 3.8–10.8)

## 2020-09-20 LAB — LIPID PANEL
Cholesterol: 152 mg/dL (ref <200)
HDL: 50 mg/dL (ref >=40)
LDL Cholesterol (Calc): 90 mg/dL (calc)
Non-HDL Cholesterol (Calc): 102 mg/dL (calc) (ref <130)
Total CHOL/HDL Ratio: 3 (calc) (ref <5.0)
Triglycerides: 42 mg/dL (ref <150)

## 2020-09-20 LAB — COMPREHENSIVE METABOLIC PANEL
AG Ratio: 1.8 (calc) (ref 1.0–2.5)
ALT: 10 U/L (ref 9–46)
AST: 10 U/L (ref 10–40)
Albumin: 4.6 g/dL (ref 3.6–5.1)
Alkaline phosphatase (APISO): 71 U/L (ref 36–130)
BUN: 12 mg/dL (ref 7–25)
CO2: 27 mmol/L (ref 20–32)
Calcium: 9.6 mg/dL (ref 8.6–10.3)
Chloride: 103 mmol/L (ref 98–110)
Creat: 1.03 mg/dL (ref 0.60–1.35)
Globulin: 2.6 g/dL (calc) (ref 1.9–3.7)
Glucose, Bld: 100 mg/dL — ABNORMAL HIGH (ref 65–99)
Potassium: 4.5 mmol/L (ref 3.5–5.3)
Sodium: 139 mmol/L (ref 135–146)
Total Bilirubin: 0.8 mg/dL (ref 0.2–1.2)
Total Protein: 7.2 g/dL (ref 6.1–8.1)

## 2020-10-24 ENCOUNTER — Encounter (INDEPENDENT_AMBULATORY_CARE_PROVIDER_SITE_OTHER): Payer: Self-pay | Admitting: Otolaryngology

## 2020-10-24 ENCOUNTER — Other Ambulatory Visit: Payer: Self-pay

## 2020-10-24 ENCOUNTER — Ambulatory Visit (INDEPENDENT_AMBULATORY_CARE_PROVIDER_SITE_OTHER): Payer: 59 | Admitting: Otolaryngology

## 2020-10-24 VITALS — Temp 97.7°F

## 2020-10-24 DIAGNOSIS — J342 Deviated nasal septum: Secondary | ICD-10-CM | POA: Diagnosis not present

## 2020-10-24 DIAGNOSIS — J3489 Other specified disorders of nose and nasal sinuses: Secondary | ICD-10-CM

## 2020-10-24 DIAGNOSIS — J310 Chronic rhinitis: Secondary | ICD-10-CM | POA: Diagnosis not present
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
## 2021-07-11 ENCOUNTER — Other Ambulatory Visit (HOSPITAL_BASED_OUTPATIENT_CLINIC_OR_DEPARTMENT_OTHER): Payer: Self-pay

## 2021-07-11 MED ORDER — CIPROFLOXACIN-DEXAMETHASONE 0.3-0.1 % OT SUSP
OTIC | 0 refills | Status: AC
  Filled 2021-07-11: qty 7.5, 19d supply, fill #0
